# Patient Record
Sex: Male | Born: 1975 | Race: Black or African American | Hispanic: No | Marital: Single | Smoking: Current every day smoker
Health system: Southern US, Community
[De-identification: ages and names within clinical notes are randomized; demographics above are authoritative.]

---

## 1999-01-31 ENCOUNTER — Encounter: Payer: Self-pay | Admitting: Emergency Medicine

## 1999-01-31 ENCOUNTER — Encounter: Payer: Self-pay | Admitting: General Surgery

## 1999-02-01 ENCOUNTER — Inpatient Hospital Stay (HOSPITAL_COMMUNITY): Admission: EM | Admit: 1999-02-01 | Discharge: 1999-02-08 | Payer: Self-pay | Admitting: Emergency Medicine

## 1999-02-01 ENCOUNTER — Encounter: Payer: Self-pay | Admitting: General Surgery

## 1999-02-02 ENCOUNTER — Encounter: Payer: Self-pay | Admitting: General Surgery

## 1999-02-03 ENCOUNTER — Encounter: Payer: Self-pay | Admitting: General Surgery

## 1999-02-04 ENCOUNTER — Encounter: Payer: Self-pay | Admitting: General Surgery

## 1999-02-05 ENCOUNTER — Encounter: Payer: Self-pay | Admitting: General Surgery

## 1999-02-06 ENCOUNTER — Encounter: Payer: Self-pay | Admitting: General Surgery

## 1999-02-07 ENCOUNTER — Encounter: Payer: Self-pay | Admitting: General Surgery

## 2000-06-07 ENCOUNTER — Encounter: Payer: Self-pay | Admitting: Emergency Medicine

## 2000-06-07 ENCOUNTER — Emergency Department (HOSPITAL_COMMUNITY): Admission: EM | Admit: 2000-06-07 | Discharge: 2000-06-07 | Payer: Self-pay | Admitting: Emergency Medicine

## 2000-09-24 ENCOUNTER — Emergency Department (HOSPITAL_COMMUNITY): Admission: EM | Admit: 2000-09-24 | Discharge: 2000-09-24 | Payer: Self-pay | Admitting: Emergency Medicine

## 2000-09-24 ENCOUNTER — Encounter: Payer: Self-pay | Admitting: Emergency Medicine

## 2009-05-10 ENCOUNTER — Emergency Department (HOSPITAL_COMMUNITY): Admission: EM | Admit: 2009-05-10 | Discharge: 2009-05-10 | Payer: Self-pay | Admitting: Emergency Medicine

## 2009-05-28 ENCOUNTER — Encounter: Admission: RE | Admit: 2009-05-28 | Discharge: 2009-05-28 | Payer: Self-pay | Admitting: Chiropractic Medicine

## 2010-03-05 ENCOUNTER — Inpatient Hospital Stay (HOSPITAL_COMMUNITY): Admission: EM | Admit: 2010-03-05 | Discharge: 2010-03-07 | Payer: Self-pay | Admitting: Emergency Medicine

## 2010-05-03 ENCOUNTER — Emergency Department (HOSPITAL_COMMUNITY): Admission: EM | Admit: 2010-05-03 | Discharge: 2010-05-03 | Payer: Self-pay | Admitting: Emergency Medicine

## 2010-05-07 ENCOUNTER — Ambulatory Visit (HOSPITAL_BASED_OUTPATIENT_CLINIC_OR_DEPARTMENT_OTHER): Admission: RE | Admit: 2010-05-07 | Discharge: 2010-05-07 | Payer: Self-pay | Admitting: Orthopedic Surgery

## 2010-09-19 IMAGING — CR DG KNEE COMPLETE 4+V*L*
4 series · 4 of 4 positions shown · non-contrast
Comparison: 03/05/2010.

CLINICAL DATA: Knee pain.  History of prior patellar surgery.

LEFT KNEE - COMPLETE 4+ VIEW

[t knee ap left]
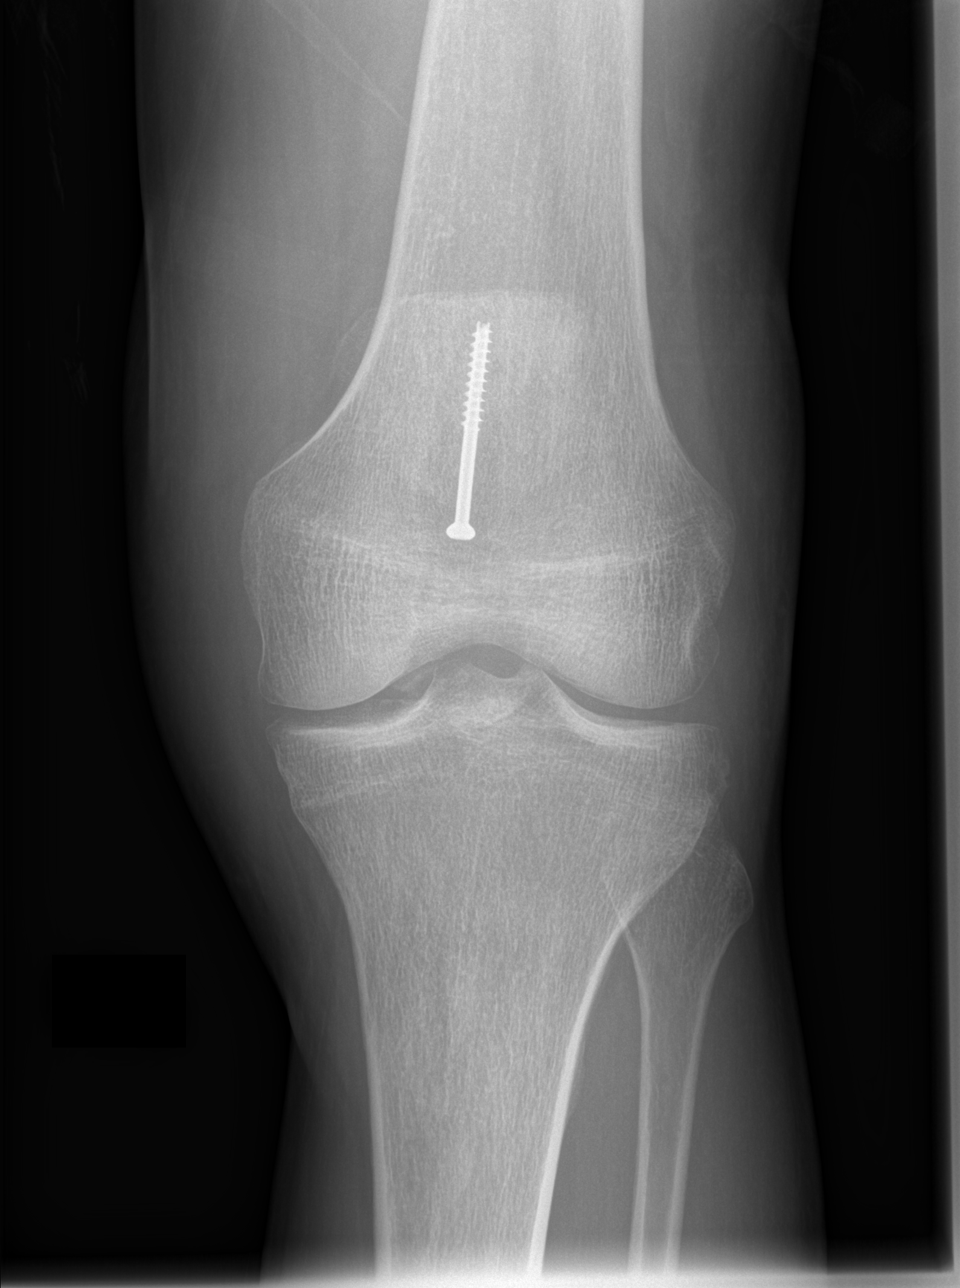

[t knee oblique left (1 of 2)]
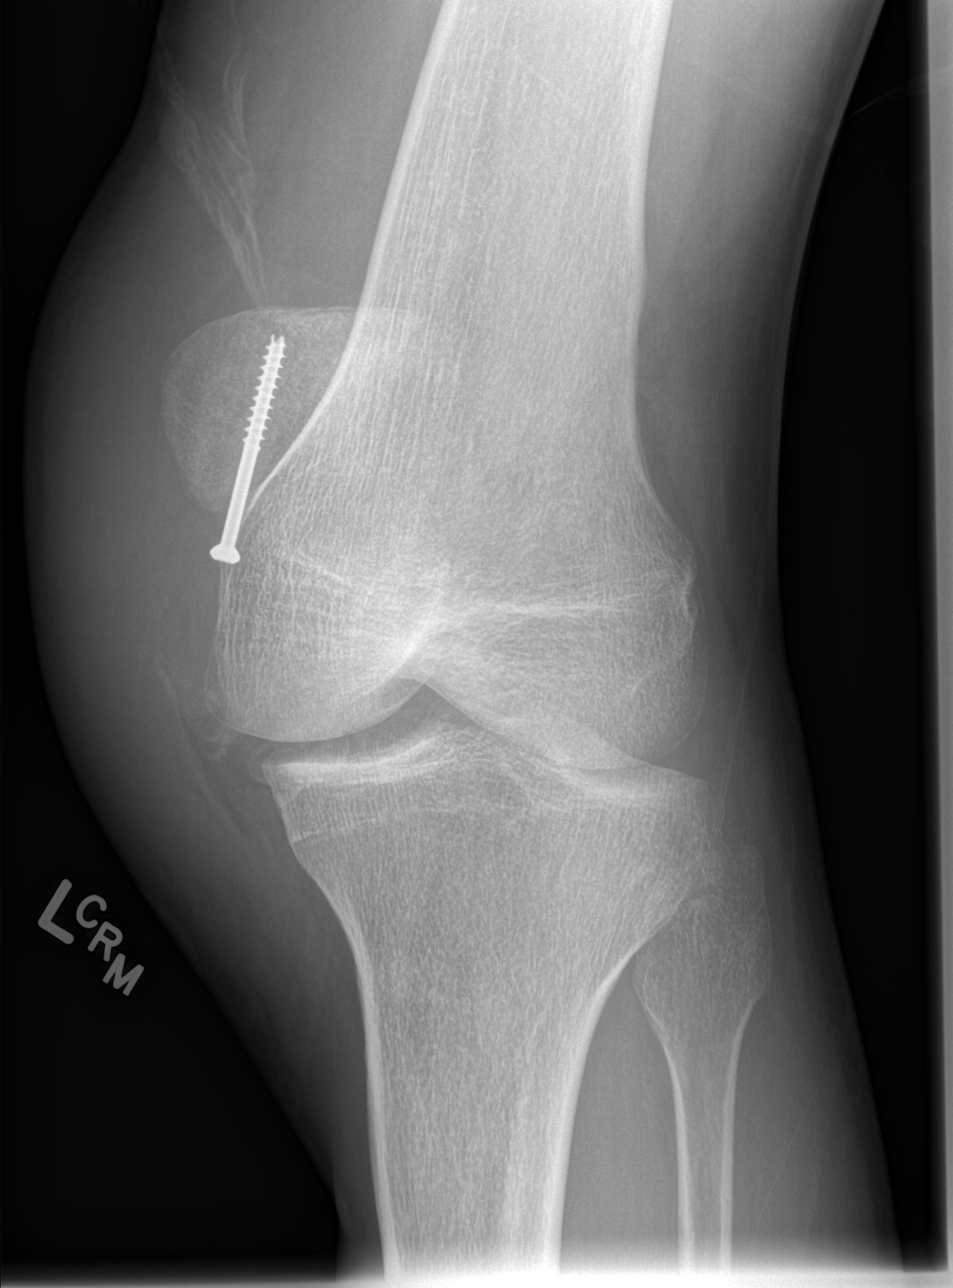

[t knee oblique left (2 of 2)]
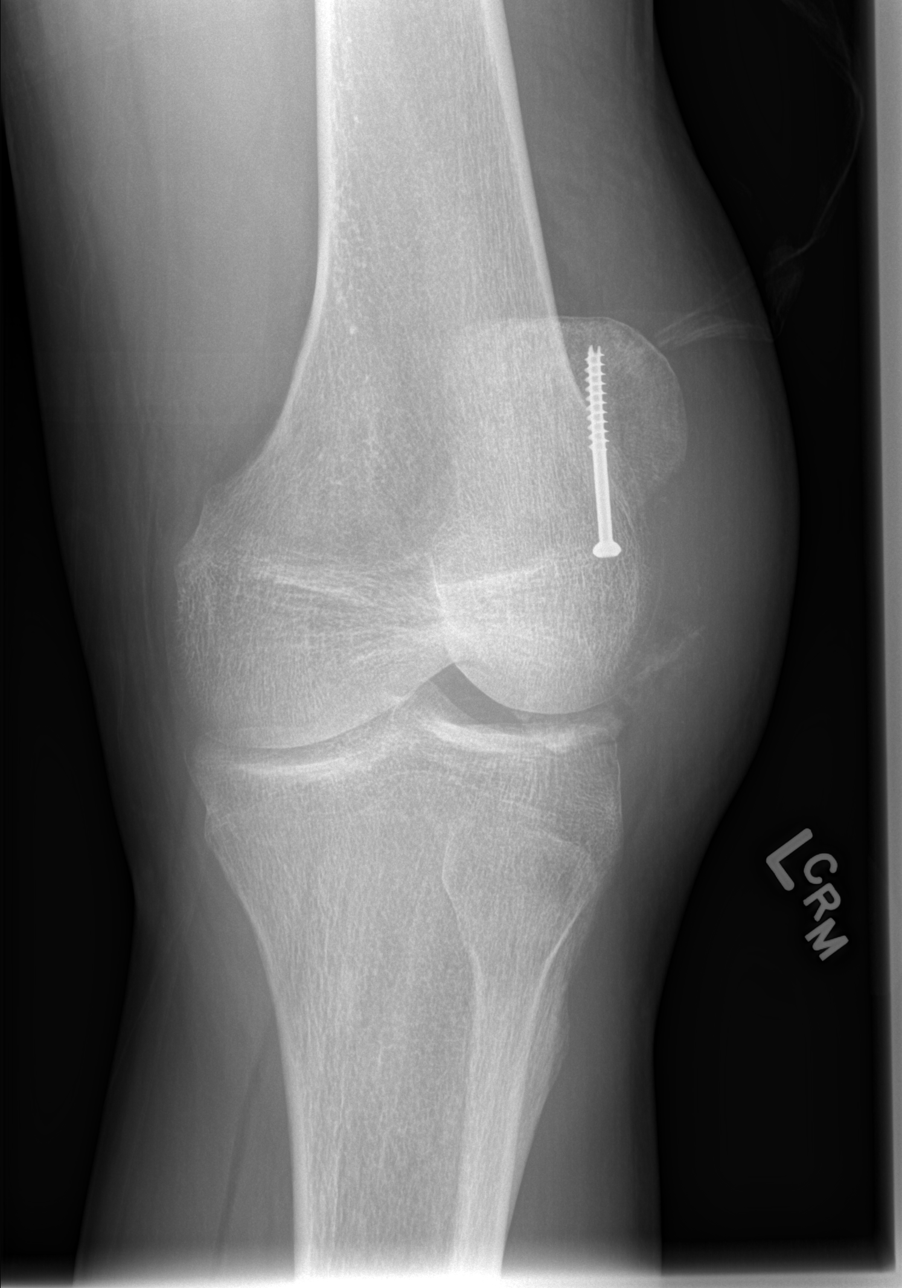

[t knee lat left]
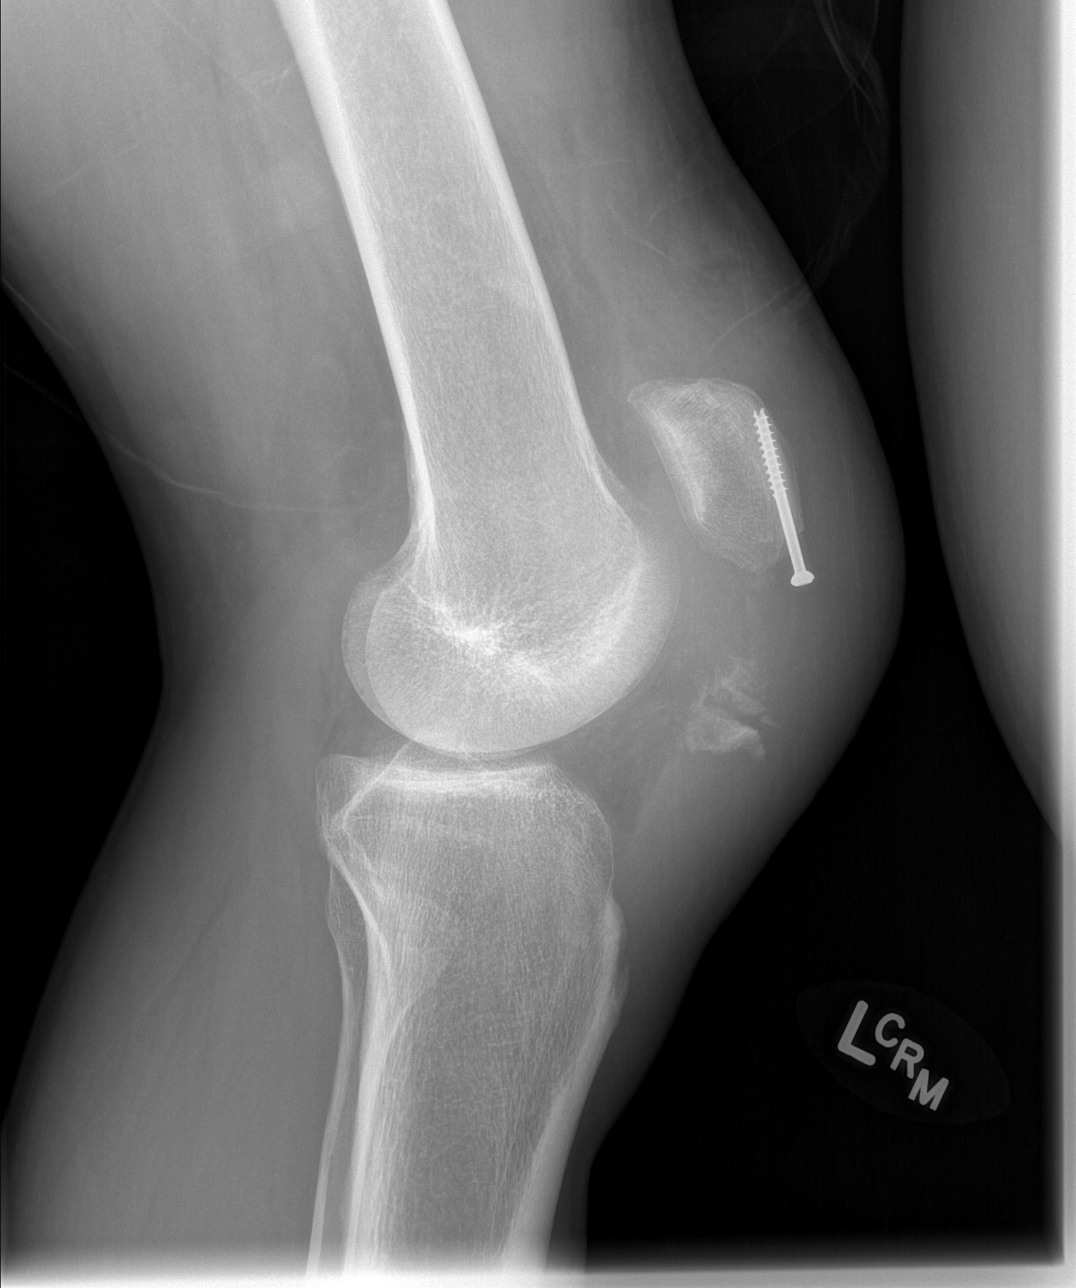

[4 of 4 positions shown; findings below may reference images not displayed]

FINDINGS: Cannulated lag screw is present through the patella.
There is a high position of the patella and the previously seen
bony fragments associated with the inferior aspect of the lag
screws have displaced inferiorly consistent with patellar tendon
avulsion/rupture.  Large amount of soft tissue swelling compatible
with hematoma.  No definite effusion.
IMPRESSION: Tear/avulsion of the superior patellar tendon from repair at the
inferior pole of the left patella. Cannulated lag screw remains
intact within the patella however the previously seen fracture
fragments of the inferior pole have displaced inferiorly.

## 2011-02-04 LAB — CBC
HCT: 42.1 % (ref 39.0–52.0)
Hemoglobin: 11.7 g/dL — ABNORMAL LOW (ref 13.0–17.0)
Hemoglobin: 12.3 g/dL — ABNORMAL LOW (ref 13.0–17.0)
Hemoglobin: 15.1 g/dL (ref 13.0–17.0)
MCHC: 35.3 g/dL (ref 30.0–36.0)
MCHC: 35.7 g/dL (ref 30.0–36.0)
MCV: 95.4 fL (ref 78.0–100.0)
RBC: 3.45 MIL/uL — ABNORMAL LOW (ref 4.22–5.81)
WBC: 6.1 10*3/uL (ref 4.0–10.5)
WBC: 6.6 10*3/uL (ref 4.0–10.5)

## 2011-02-04 LAB — POCT I-STAT, CHEM 8
Calcium, Ion: 1.07 mmol/L — ABNORMAL LOW (ref 1.12–1.32)
Creatinine, Ser: 1.7 mg/dL — ABNORMAL HIGH (ref 0.4–1.5)
HCT: 45 % (ref 39.0–52.0)
Potassium: 3.6 mEq/L (ref 3.5–5.1)
TCO2: 22 mmol/L (ref 0–100)

## 2011-02-04 LAB — DIFFERENTIAL
Basophils Absolute: 0 10*3/uL (ref 0.0–0.1)
Eosinophils Absolute: 0 10*3/uL (ref 0.0–0.7)
Eosinophils Relative: 0 % (ref 0–5)
Lymphs Abs: 1.2 10*3/uL (ref 0.7–4.0)
Monocytes Absolute: 0.6 10*3/uL (ref 0.1–1.0)
Monocytes Relative: 5 % (ref 3–12)
Neutrophils Relative %: 86 % — ABNORMAL HIGH (ref 43–77)

## 2011-02-04 LAB — BASIC METABOLIC PANEL
BUN: 5 mg/dL — ABNORMAL LOW (ref 6–23)
CO2: 28 mEq/L (ref 19–32)
Calcium: 8.3 mg/dL — ABNORMAL LOW (ref 8.4–10.5)
Calcium: 8.4 mg/dL (ref 8.4–10.5)
Chloride: 100 mEq/L (ref 96–112)
GFR calc Af Amer: >60 mL/min (ref >60)
GFR calc non Af Amer: >60 mL/min (ref >60)
GFR calc non Af Amer: >60 mL/min (ref >60)
Potassium: 4.1 mEq/L (ref 3.5–5.1)
Potassium: 4.1 mEq/L (ref 3.5–5.1)
Sodium: 136 mEq/L (ref 135–145)
Sodium: 137 mEq/L (ref 135–145)

## 2011-02-04 LAB — ETHANOL: Alcohol, Ethyl (B): 158 mg/dL — ABNORMAL HIGH (ref 0–10)

## 2011-02-04 LAB — SAMPLE TO BLOOD BANK

## 2018-09-18 ENCOUNTER — Other Ambulatory Visit: Payer: Self-pay

## 2018-09-18 ENCOUNTER — Emergency Department (HOSPITAL_COMMUNITY)
Admission: EM | Admit: 2018-09-18 | Discharge: 2018-09-18 | Disposition: A | Discharge status: Home / Self Care | Payer: Self-pay | Attending: Emergency Medicine | Admitting: Emergency Medicine

## 2018-09-18 ENCOUNTER — Encounter (HOSPITAL_COMMUNITY): Payer: Self-pay | Admitting: Emergency Medicine

## 2018-09-18 DIAGNOSIS — Z23 Encounter for immunization: Secondary | ICD-10-CM | POA: Insufficient documentation

## 2018-09-18 DIAGNOSIS — S0181XA Laceration without foreign body of other part of head, initial encounter: Secondary | ICD-10-CM | POA: Insufficient documentation

## 2018-09-18 DIAGNOSIS — Y929 Unspecified place or not applicable: Secondary | ICD-10-CM | POA: Insufficient documentation

## 2018-09-18 DIAGNOSIS — Y999 Unspecified external cause status: Secondary | ICD-10-CM | POA: Insufficient documentation

## 2018-09-18 DIAGNOSIS — Y9389 Activity, other specified: Secondary | ICD-10-CM | POA: Insufficient documentation

## 2018-09-18 DIAGNOSIS — F17210 Nicotine dependence, cigarettes, uncomplicated: Secondary | ICD-10-CM | POA: Insufficient documentation

## 2018-09-18 MED ORDER — TETANUS-DIPHTH-ACELL PERTUSSIS 5-2.5-18.5 LF-MCG/0.5 IM SUSP
0.5000 mL | Freq: Once | INTRAMUSCULAR | Status: AC
  Administered 2018-09-18: 0.5 mL via INTRAMUSCULAR
  Filled 2018-09-18: qty 0.5

## 2018-09-18 MED ORDER — LIDOCAINE HCL (PF) 1 % IJ SOLN
5.0000 mL | Freq: Once | INTRAMUSCULAR | Status: DC
  Filled 2018-09-18: qty 30
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
## 2018-09-18 NOTE — Discharge Instructions (Signed)
Evaluated today after facial laceration.  You will need to have the sutures removed in 2 to 4 days.  Return to the ED for any new or worsening symptoms such as headache, eye pain, increased facial swelling, facial redness or warmth.
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
## 2023-05-01 NOTE — Care Management Important Message (Signed)
Important Message  Patient Details  Name: DARRENCE SIEGMAN MRN: [id number] Date of Birth: [birth year]   Medicare Important Message Given:  Yes     Dorena Bodo 05/01/2023, 2:47 PM
